# Patient Record
Sex: Female | Born: 1985 | Hispanic: Yes | Marital: Single | Smoking: Never smoker
Health system: Southern US, Community
[De-identification: ages and names within clinical notes are randomized; demographics above are authoritative.]

---

## 2016-10-20 ENCOUNTER — Other Ambulatory Visit: Payer: Self-pay | Admitting: Nurse Practitioner

## 2016-10-20 DIAGNOSIS — N6320 Unspecified lump in the left breast, unspecified quadrant: Secondary | ICD-10-CM

## 2016-10-21 ENCOUNTER — Emergency Department (HOSPITAL_COMMUNITY): Payer: BLUE CROSS/BLUE SHIELD

## 2016-10-21 ENCOUNTER — Encounter (HOSPITAL_COMMUNITY): Payer: Self-pay | Admitting: *Deleted

## 2016-10-21 ENCOUNTER — Emergency Department (HOSPITAL_COMMUNITY)
Admission: EM | Admit: 2016-10-21 | Discharge: 2016-10-21 | Disposition: A | Discharge status: Home / Self Care | Payer: BLUE CROSS/BLUE SHIELD | Attending: Emergency Medicine | Admitting: Emergency Medicine

## 2016-10-21 DIAGNOSIS — M546 Pain in thoracic spine: Secondary | ICD-10-CM

## 2016-10-21 DIAGNOSIS — M5489 Other dorsalgia: Secondary | ICD-10-CM | POA: Diagnosis present

## 2016-10-21 LAB — PREGNANCY, URINE: PREG TEST UR: NEGATIVE

## 2016-10-21 MED ORDER — KETOROLAC TROMETHAMINE 30 MG/ML IJ SOLN
30.0000 mg | Freq: Once | INTRAMUSCULAR | Status: DC
Start: 2016-10-21 — End: 2016-10-21
  Filled 2016-10-21: qty 1

## 2016-10-21 MED ORDER — METHOCARBAMOL 500 MG PO TABS
500.0000 mg | ORAL_TABLET | Freq: Once | ORAL | Status: AC
  Administered 2016-10-21: 500 mg via ORAL
  Filled 2016-10-21: qty 1

## 2016-10-21 MED ORDER — NAPROXEN 375 MG PO TABS: 375.0000 mg | ORAL_TABLET | Freq: Two times a day (BID) | ORAL | 0 refills | Status: AC

## 2016-10-21 MED ORDER — CYCLOBENZAPRINE HCL 10 MG PO TABS: 10.0000 mg | ORAL_TABLET | Freq: Three times a day (TID) | ORAL | 0 refills | Status: AC | PRN

## 2016-10-21 MED ORDER — OXYCODONE-ACETAMINOPHEN 5-325 MG PO TABS
ORAL_TABLET | ORAL | Status: AC
  Filled 2016-10-21: qty 1

## 2016-10-21 MED ORDER — OXYCODONE-ACETAMINOPHEN 5-325 MG PO TABS
1.0000 | ORAL_TABLET | ORAL | Status: DC | PRN
  Administered 2016-10-21: 1 via ORAL

## 2016-10-21 MED ORDER — KETOROLAC TROMETHAMINE 30 MG/ML IJ SOLN
30.0000 mg | Freq: Once | INTRAMUSCULAR | Status: AC
  Administered 2016-10-21: 30 mg via INTRAMUSCULAR

## 2016-10-21 NOTE — ED Notes (Signed)
Returned from radiology. 

## 2016-10-21 NOTE — ED Notes (Signed)
Provider stated not to administer Toradol until pregnancy test resulted.

## 2016-10-21 NOTE — ED Notes (Signed)
Patient transported to X-ray 

## 2016-10-21 NOTE — ED Provider Notes (Signed)
[facility]-EMERGENCY DEPT Provider Note   CSN: [id number] Arrival date & time: 10/21/16  0810     History   Chief Complaint Chief Complaint  Patient presents with  . Back Pain    HPI Shirley Moore is a 31 y.o. female.  HPI A 31 year old female with no significant past medical history presents to the emergency Department today with complaints of upper back pain. Patient states that her pain has been ongoing for 2 days. States that 2 days ago she went to the water park and went down several slides. She also states that she went to the gym the following day. States that the pain progressively worsened. Describes the pain as tense in nature and located on her bilateral upper back. Moving makes the pain worse. Holding still makes the pain better. She states that she was seen at her school clinic yesterday for same without any x-rays at that time. They prescribe the patient ibuprofen. States that this has helped however this morning she was moving around and felt a "pop" in her back. Patient also states that she has had some decreased sensation in her upper left arm and left lower leg. Denies any weakness but reports some intermittent paresthesias. Patient denies any associated symptoms of low back pain, neck pain, headache, vision changes, saddle paresthesia, loss of bowel or bladder, urinary retention.  Patient denies any history of an OCP use, prolonged immobilization or recent hospitalizations/surgeries, history of DVT/PE, tobacco use, lower extremity edema or calf tenderness. Denies any cardiac history or significant family cardiac history.  Pt denies any fever, chill, ha, vision changes, lightheadedness, dizziness, congestion, neck pain, cp, sob, cough, abd pain, n/v/d, urinary symptoms, change in bowel habits, melena, hematochezia.  History reviewed. No pertinent past medical history.  There are no active problems to display for this patient.   History reviewed. No pertinent surgical  history.  OB History    No data available       Home Medications    Prior to Admission medications   Medication Sig Start Date End Date Taking? Authorizing Provider  ibuprofen (ADVIL,MOTRIN) 800 MG tablet Take 800 mg by mouth every 8 (eight) hours. 10/20/16  Yes [provider]  cyclobenzaprine (FLEXERIL) 10 MG tablet Take 1 tablet (10 mg total) by mouth 3 (three) times daily as needed for muscle spasms. 10/21/16   Rise Mu, PA-C  naproxen (NAPROSYN) 375 MG tablet Take 1 tablet (375 mg total) by mouth 2 (two) times daily. 10/21/16   Rise Mu, PA-C    Family History No family history on file.  Social History Social History  Substance Use Topics  . Smoking status: Never Smoker  . Smokeless tobacco: Never Used  . Alcohol use Not on file     Allergies   Patient has no known allergies.   Review of Systems Review of Systems  Constitutional: Negative for chills and fever.  HENT: Negative for congestion.   Eyes: Negative for visual disturbance.  Respiratory: Negative for cough and shortness of breath.   Cardiovascular: Negative for chest pain.  Gastrointestinal: Negative for abdominal pain, diarrhea, nausea and vomiting.  Genitourinary: Negative for dysuria, flank pain, frequency, hematuria, urgency, vaginal bleeding and vaginal discharge.  Musculoskeletal: Positive for back pain. Negative for arthralgias and myalgias.  Skin: Negative for rash.  Neurological: Positive for numbness. Negative for dizziness, syncope, weakness, light-headedness and headaches.  Psychiatric/Behavioral: Negative for sleep disturbance. The patient is not nervous/anxious.      Physical Exam Updated Vital Signs  BP 105/67   Pulse 60   Temp 98.2 F (36.8 C) (Oral)   Resp 16   LMP 09/29/2016   SpO2 100%   Physical Exam  Constitutional: She is oriented to person, place, and time. She appears well-developed and well-nourished.  Non-toxic appearance. No distress.  HENT:   Head: Normocephalic and atraumatic.  Nose: Nose normal.  Mouth/Throat: Oropharynx is clear and moist.  Eyes: Pupils are equal, round, and reactive to light. Conjunctivae are normal. Right eye exhibits no discharge. Left eye exhibits no discharge.  Neck: Normal range of motion. Neck supple.  Cardiovascular: Normal rate, regular rhythm, normal heart sounds and intact distal pulses.   Pulmonary/Chest: Effort normal and breath sounds normal. No respiratory distress. She exhibits no tenderness.  Abdominal: Soft. Bowel sounds are normal. There is no tenderness. There is no rebound and no guarding.  Musculoskeletal: Normal range of motion. She exhibits no tenderness.   midline T spine tenderness. No L spine tenderness. Bilateral thoracic paraspinal tenderness to palpation. No deformities or step offs noted. Full ROM. Pelvis is stable.   Lymphadenopathy:    She has no cervical adenopathy.  Neurological: She is alert and oriented to person, place, and time.  The patient is alert, attentive, and oriented x 3. Speech is clear. Cranial nerve II-VII grossly intact. Negative pronator drift. Sensation intact. Strength 5/5 in all extremities. Reflexes 2+ and symmetric at biceps, triceps, knees, and ankles. Rapid alternating movement and fine finger movements intact. Romberg is absent. Posture and gait normal.  Sensation intact to sharp/dull, propreioperception intact, point discrimination normal.  Skin: Skin is warm and dry. Capillary refill takes less than 2 seconds.  Psychiatric: Her behavior is normal. Judgment and thought content normal.  Nursing note and vitals reviewed.    ED Treatments / Results  Labs (all labs ordered are listed, but only abnormal results are displayed) Labs Reviewed  PREGNANCY, URINE    EKG  EKG Interpretation None       Radiology Dg Cervical Spine Complete  Result Date: 10/21/2016 CLINICAL DATA:  Back pain EXAM: CERVICAL SPINE - COMPLETE 4+ VIEW COMPARISON:  None.  FINDINGS: Slight anterolisthesis C4-5. Negative for fracture. Disc spaces maintained. No significant foraminal narrowing or spurring. IMPRESSION: Mild anterolisthesis C4-5 which may be due to mild degenerative change. No significant foraminal encroachment. Electronically Signed   By: Marlan Palauharles  Clark M.D.   On: 10/21/2016 11:11   Dg Thoracic Spine 2 View  Result Date: 10/21/2016 CLINICAL DATA:  New onset of left scapular pain beginning 2 days ago. EXAM: THORACIC SPINE 2 VIEWS COMPARISON:  None. FINDINGS: Twelve non rib-bearing lumbar type vertebral bodies are present. Vertebral body heights and alignment are maintained. Visualize ribs and clavicles are unremarkable. Visualized portions of the scapula are within normal limits. IMPRESSION: Negative thoracic spine radiographs. Electronically Signed   By: Marin Robertshristopher  Mattern M.D.   On: 10/21/2016 11:24    Procedures Procedures (including critical care time)  Medications Ordered in ED Medications  methocarbamol (ROBAXIN) tablet 500 mg (500 mg Oral Given 10/21/16 1208)  ketorolac (TORADOL) 30 MG/ML injection 30 mg (30 mg Intramuscular Given 10/21/16 1209)     Initial Impression / Assessment and Plan / ED Course  I have reviewed the triage vital signs and the nursing notes.  Pertinent labs & imaging results that were available during my care of the patient were reviewed by me and considered in my medical decision making (see chart for details).     Patient presents to the ED with  complaints of midline upper back pain. Patient also notes some intermittent left arm and left leg decreased sensation. Denies any weakness. Patient denies any red flag symptoms of the concern for cauda equina.   Patient is overall well-appearing and nontoxic. She does have some midline tenderness of the thoracic spine and paraspinal tenderness to palpation. Pain is reproducible. Vital signs are reassuring. Patient is not hypoxia, no tachypnea, no tachycardia.  X-rays of  cervical spine and thoracic spine are unremarkable. Pain is been treated in the ED with Toradol and muscle relaxers. Appears to be more musculoskeletal. Patient has no focal neuro deficits. She is neurovascularly intact in all extremities. The complaints of left leg and left arm decreased sensation does not seem consistent with spinal cord injury or stroke given no focal deficit at this time.  Patient is PERC negative for low suspicion for PE. Patient is ambulatory with normal gait.  Would like to treat with anti-inflammatories and muscle relaxers at this time. Patient may need follow-up with orthopedics for further imaging if symptoms do not improve. Patient is overall well-appearing.  Pt is hemodynamically stable, in NAD, & able to ambulate in the ED. Evaluation does not show pathology that would require ongoing emergent intervention or inpatient treatment. I explained the diagnosis to the patient. Pain has been managed & has no complaints prior to dc. Pt is comfortable with above plan and is stable for discharge at this time. All questions were answered prior to disposition. Strict return precautions for f/u to the ED were discussed. Encouraged follow up with PCP.  Dicussed with Dr. Madilyn Hook who is agreeable with the above plan.    Final Clinical Impressions(s) / ED Diagnoses   Final diagnoses:  Acute bilateral thoracic back pain    New Prescriptions Discharge Medication List as of 10/21/2016 12:53 PM    START taking these medications   Details  cyclobenzaprine (FLEXERIL) 10 MG tablet Take 1 tablet (10 mg total) by mouth 3 (three) times daily as needed for muscle spasms., Starting Thu 10/21/2016, Print    naproxen (NAPROSYN) 375 MG tablet Take 1 tablet (375 mg total) by mouth 2 (two) times daily., Starting Thu 10/21/2016, Print         Rise Mu, PA-C 10/21/16 1755    Tilden Fossa, MD 10/23/16 [phone]

## 2016-10-21 NOTE — ED Triage Notes (Signed)
Pt states that she began having pain in her back and shoulders 2 days ago after going to the water park and gym. Pt states that she woke today with less pain after visiting the doctor and taking ibuprofen as prescribed. Pt states that she began moving around thi morning and felt  Pop. Pt states that she has decreased sensation in her left arm and leg. Pt ambulatory. Pt reports worsening pain with movement.

## 2016-10-21 NOTE — Discharge Instructions (Signed)
Workup has been normal. Please take medications as prescribed and instructed.  Please take the Naproxen as prescribed for pain. Do not take any additional NSAIDs including Motrin, Aleve, Ibuprofen, Advil.  Please the the flexeril for muscle relaxation. This medication will make you drowsy so avoid situation that could place you in danger.   If you are not improved in 3-4 days recheck with pcp or ed or if worse.   SEEK IMMEDIATE MEDICAL ATTENTION IF: New numbness, tingling, weakness, or problem with the use of your arms or legs.  Severe back pain not relieved with medications.  Change in bowel or bladder control.  Urinary retention.  Numbness in your groin.  Increasing pain in any areas of the body (such as chest or abdominal pain).  Shortness of breath, dizziness or fainting.  Nausea (feeling sick to your stomach), vomiting, fever, or sweats.

## 2016-10-25 ENCOUNTER — Inpatient Hospital Stay
Admission: RE | Admit: 2016-10-25 | Discharge: 2016-10-25 | Disposition: A | Discharge status: Home / Self Care | Payer: Self-pay | Source: Ambulatory Visit | Attending: Nurse Practitioner | Admitting: Nurse Practitioner

## 2018-11-09 IMAGING — CR DG CERVICAL SPINE COMPLETE 4+V
5 series · 5 of 5 positions shown · non-contrast
Comparison: None.

CLINICAL DATA: Back pain

EXAM:
CERVICAL SPINE - COMPLETE 4+ VIEW

[c-spine lat]
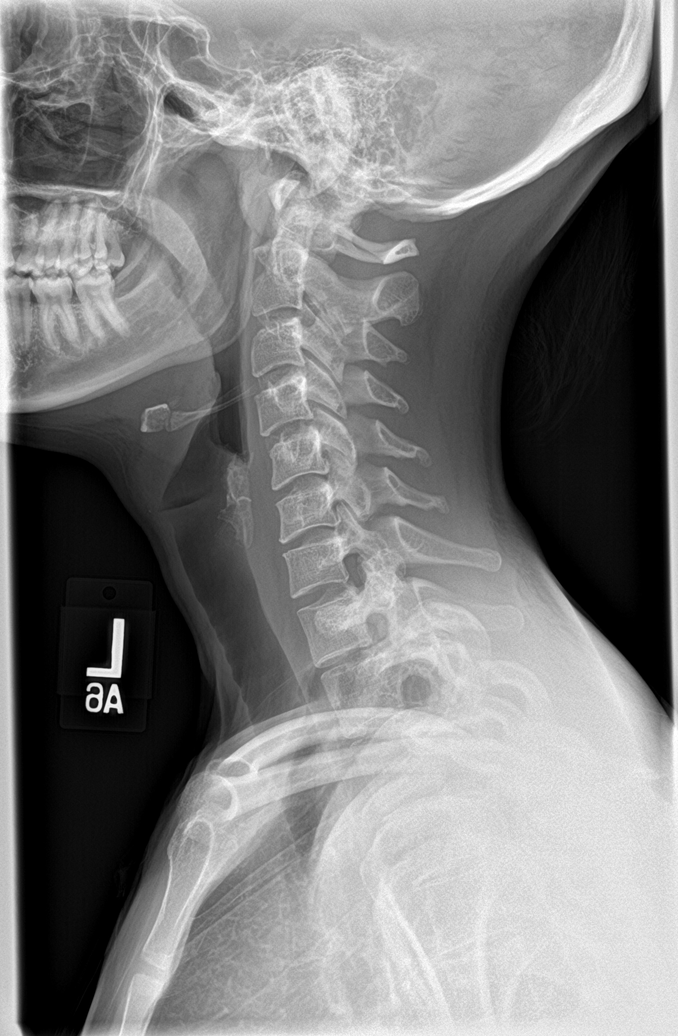

[c-spine obl (1 of 2)]
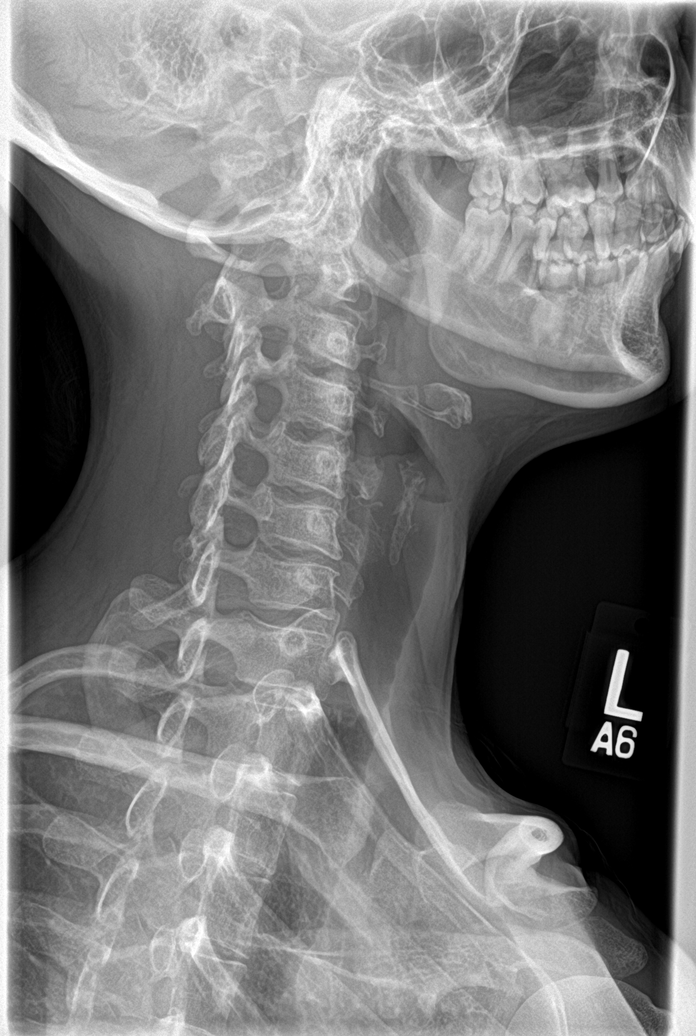

[c-spine obl (2 of 2)]
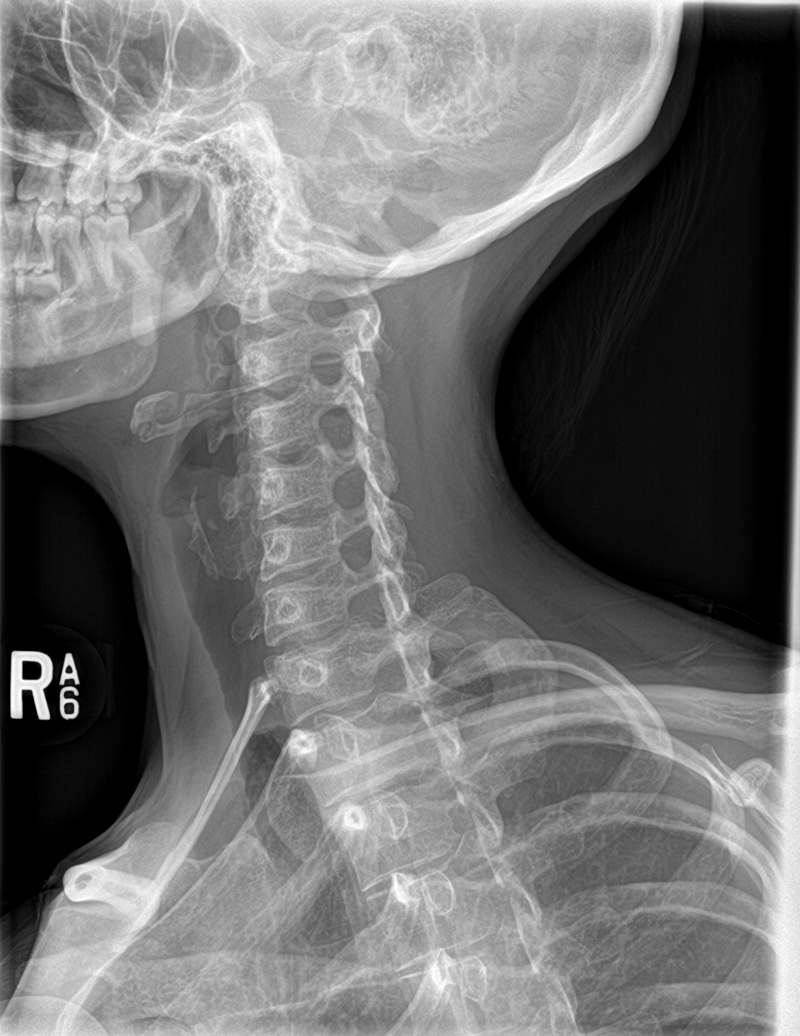

[c-spine ap]
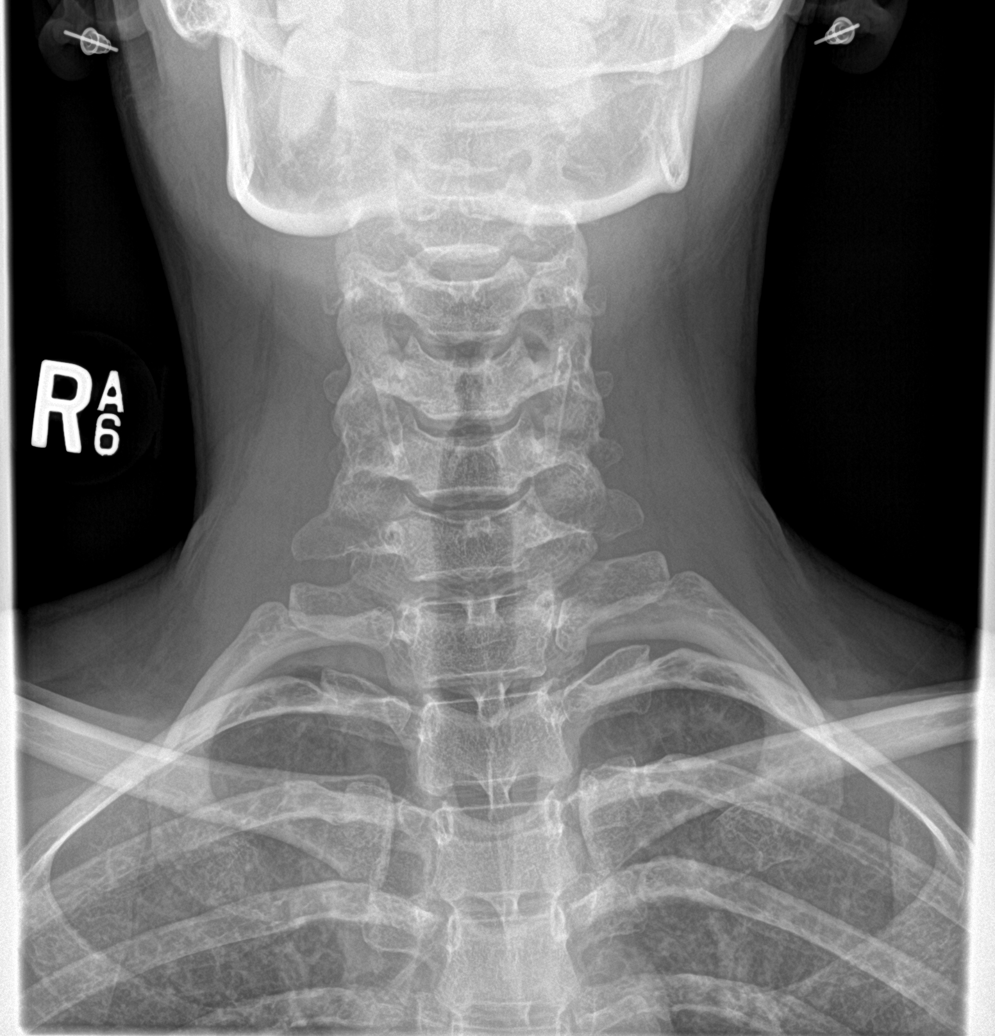

[c-spine open mouth]
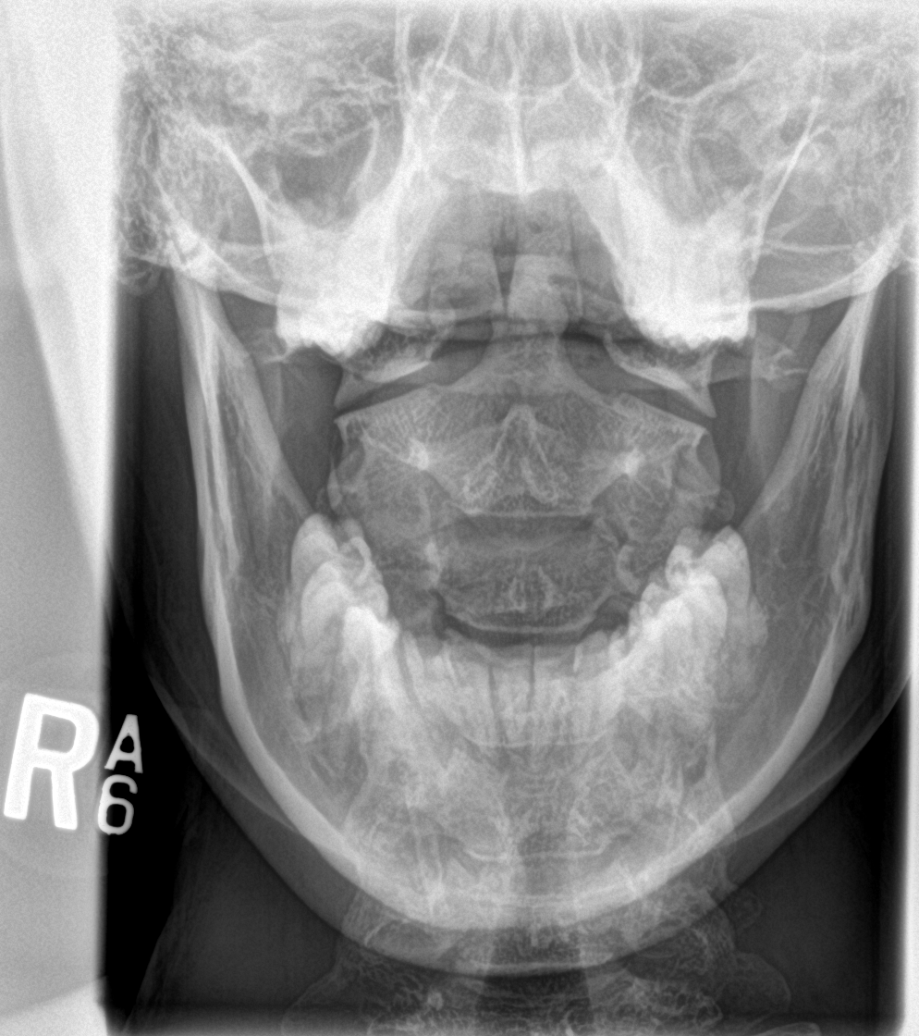

[5 of 5 positions shown; findings below may reference images not displayed]

FINDINGS: Slight anterolisthesis C4-5. Negative for fracture. Disc spaces
maintained. No significant foraminal narrowing or spurring.
IMPRESSION: Mild anterolisthesis C4-5 which may be due to mild degenerative
change. No significant foraminal encroachment.

## 2018-11-09 IMAGING — CR DG THORACIC SPINE 2V
3 series · 3 of 3 positions shown · non-contrast
Comparison: None.

CLINICAL DATA: New onset of left scapular pain beginning 2 days
ago.

EXAM:
THORACIC SPINE 2 VIEWS

[t-spine lat]
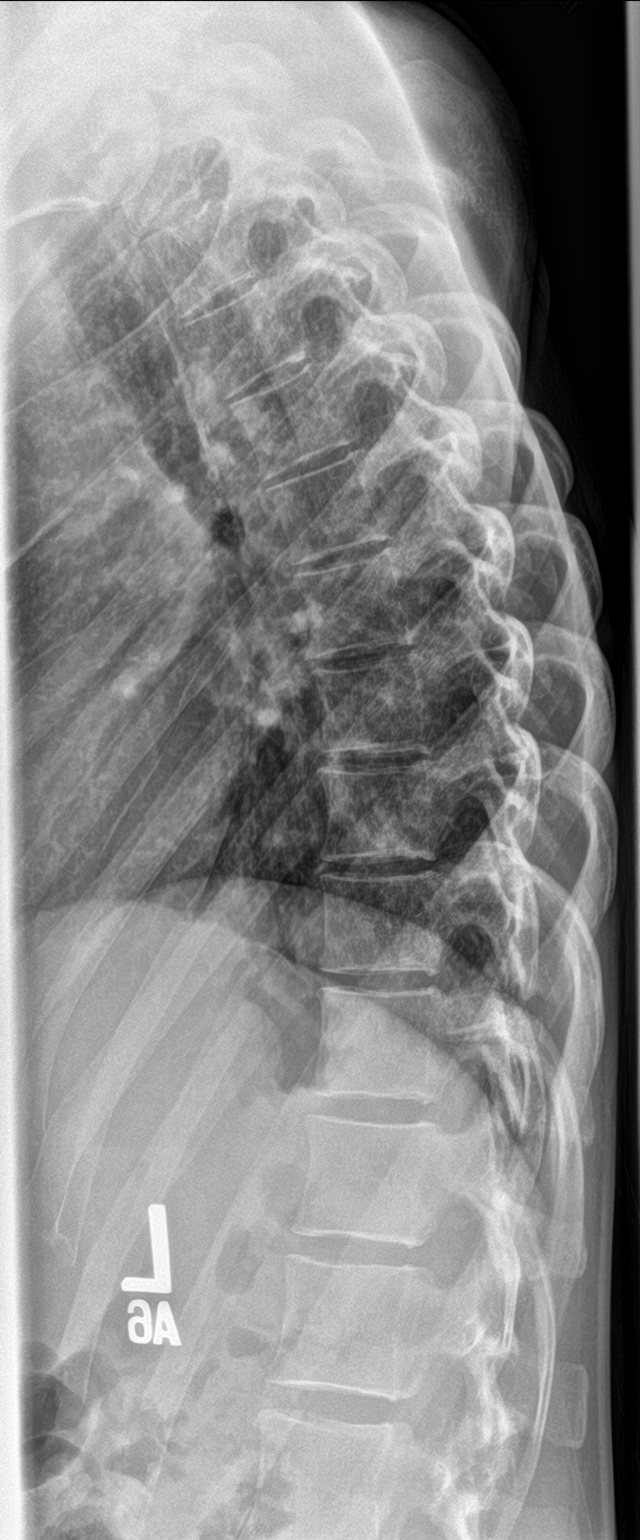

[t-spine swimmers]
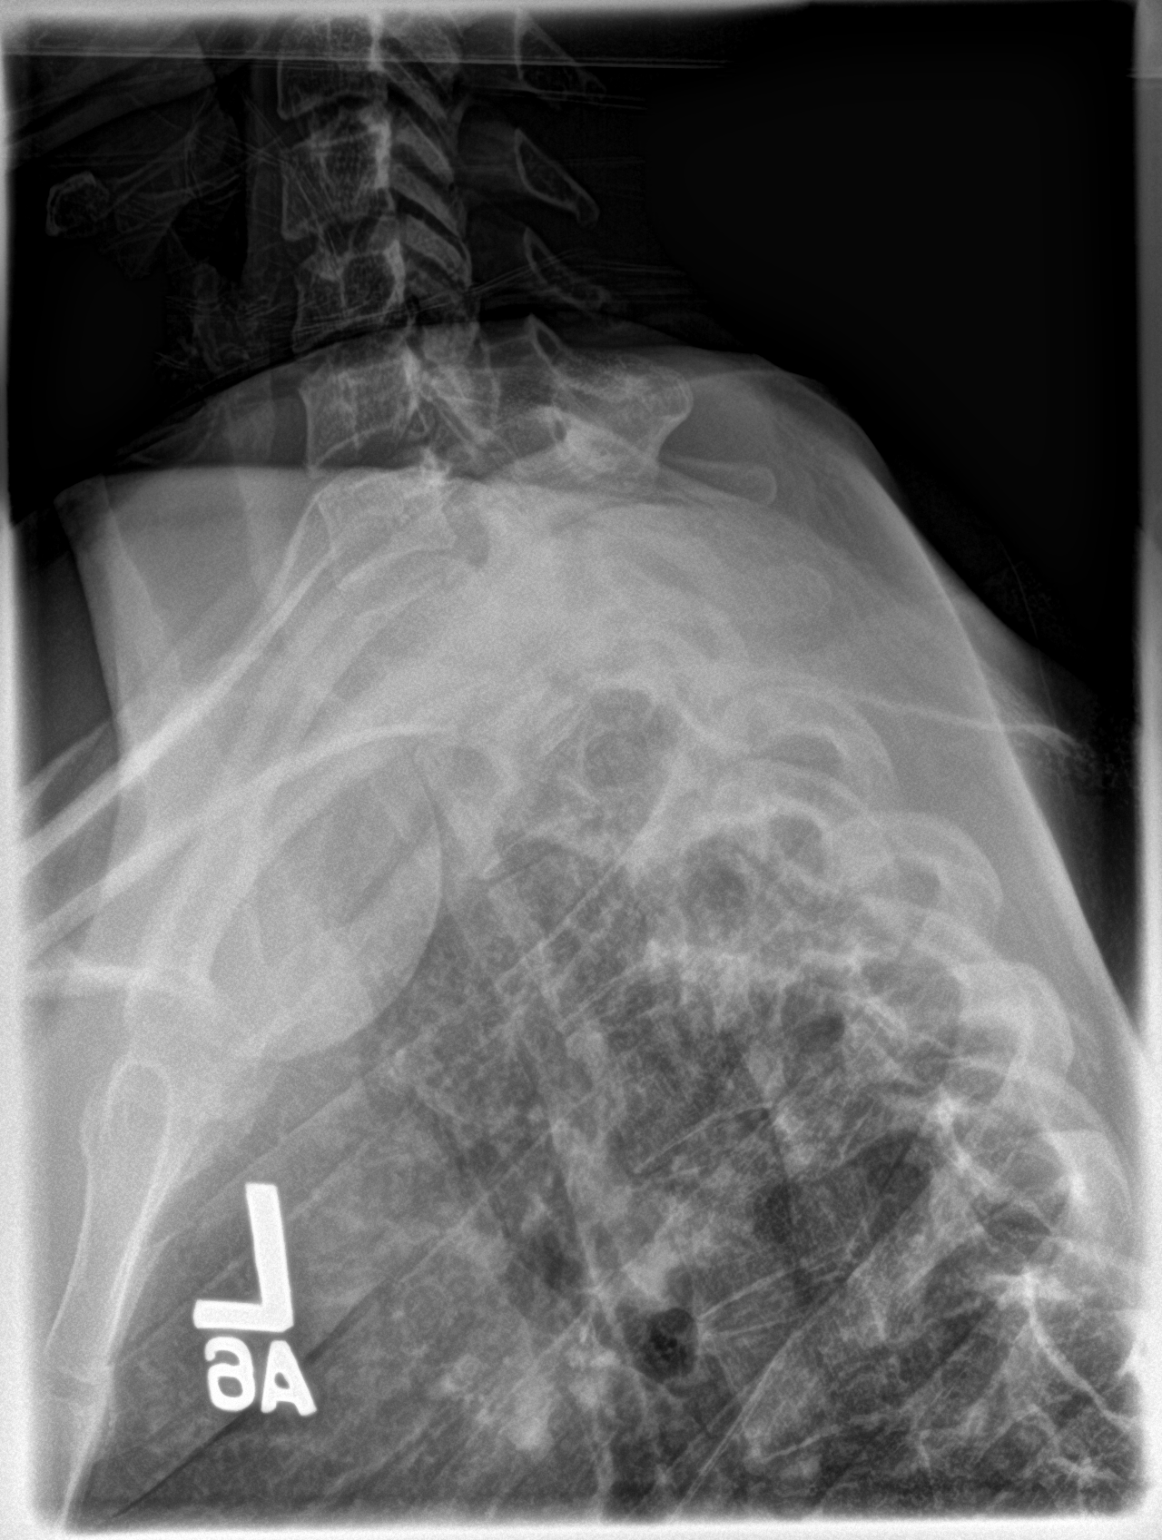

[t-spine ap]
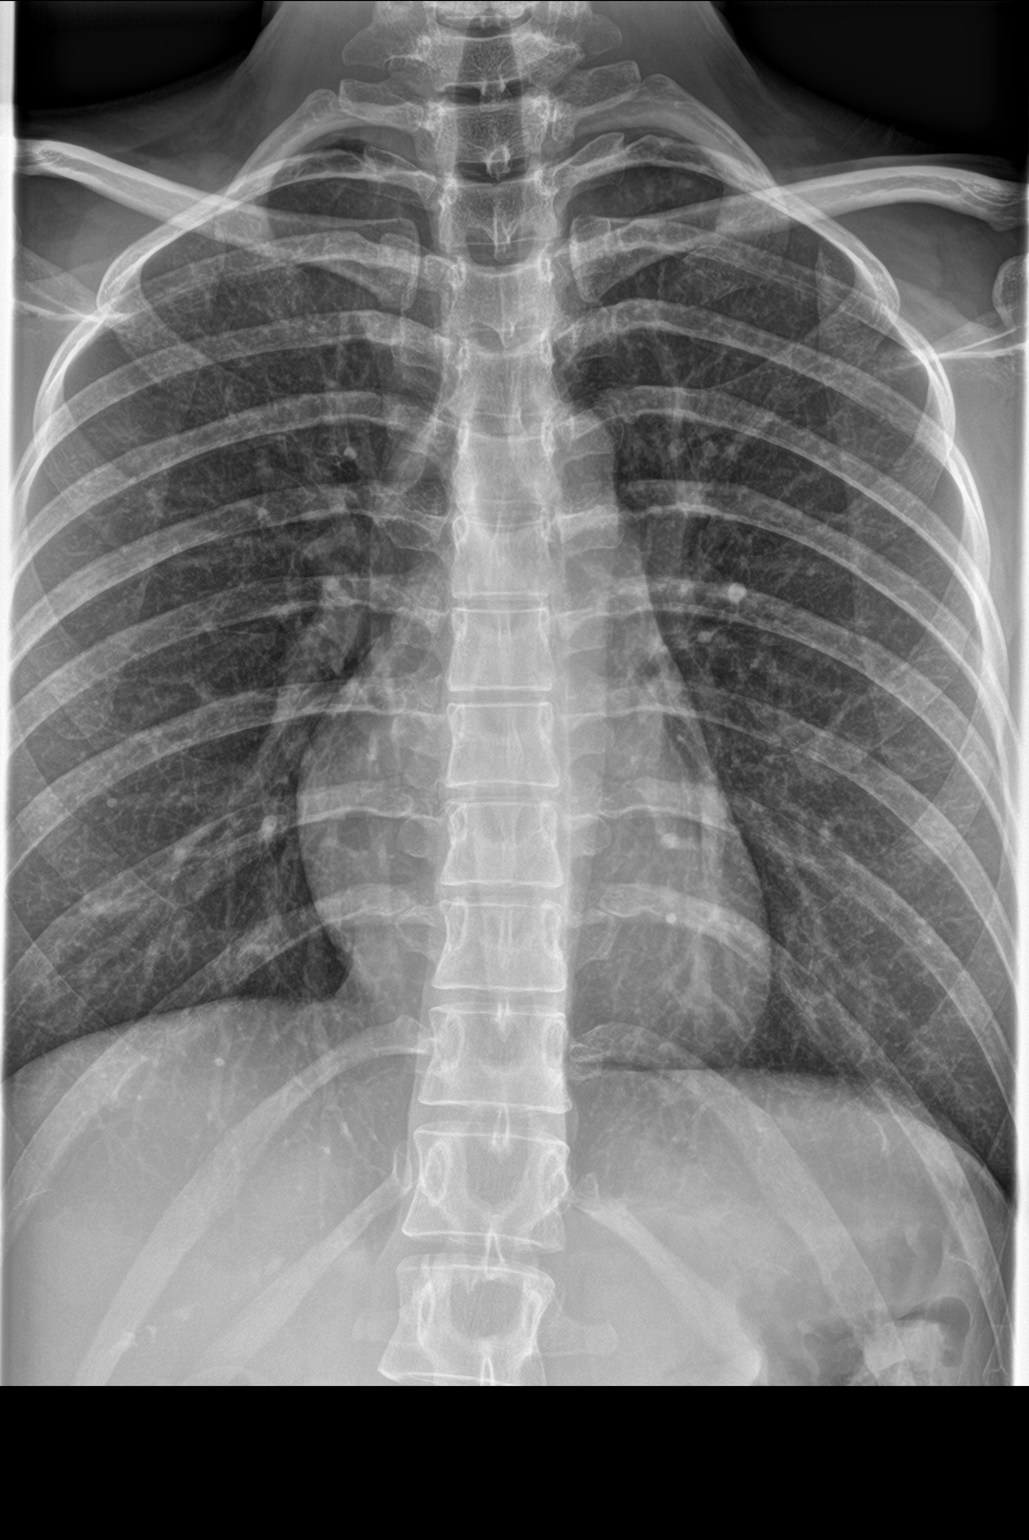

[3 of 3 positions shown; findings below may reference images not displayed]

FINDINGS: Twelve non rib-bearing lumbar type vertebral bodies are present.
Vertebral body heights and alignment are maintained. Visualize ribs
and clavicles are unremarkable. Visualized portions of the scapula
are within normal limits.
IMPRESSION: Negative thoracic spine radiographs.
